# Patient Record
Sex: Male | Born: 1960 | Race: Black or African American | Hispanic: No | Smoking: Current every day smoker
Health system: Southern US, Community
[De-identification: ages and names within clinical notes are randomized; demographics above are authoritative.]

## PROBLEM LIST (undated history)

## (undated) HISTORY — PX: ROTATOR CUFF REPAIR: SHX139

---

## 1999-09-03 ENCOUNTER — Encounter: Admission: RE | Admit: 1999-09-03 | Discharge: 1999-09-13 | Payer: Self-pay | Admitting: Unknown Physician Specialty

## 2011-02-23 ENCOUNTER — Ambulatory Visit: Payer: Self-pay

## 2014-01-13 ENCOUNTER — Emergency Department (HOSPITAL_COMMUNITY): Payer: No Typology Code available for payment source

## 2014-01-13 ENCOUNTER — Emergency Department (HOSPITAL_COMMUNITY)
Admission: EM | Admit: 2014-01-13 | Discharge: 2014-01-13 | Disposition: A | Discharge status: Home / Self Care | Payer: No Typology Code available for payment source | Attending: Emergency Medicine | Admitting: Emergency Medicine

## 2014-01-13 ENCOUNTER — Encounter (HOSPITAL_COMMUNITY): Payer: Self-pay

## 2014-01-13 DIAGNOSIS — S3992XA Unspecified injury of lower back, initial encounter: Secondary | ICD-10-CM | POA: Diagnosis not present

## 2014-01-13 DIAGNOSIS — Z720 Tobacco use: Secondary | ICD-10-CM | POA: Diagnosis not present

## 2014-01-13 DIAGNOSIS — Y92410 Unspecified street and highway as the place of occurrence of the external cause: Secondary | ICD-10-CM | POA: Diagnosis not present

## 2014-01-13 DIAGNOSIS — Y9389 Activity, other specified: Secondary | ICD-10-CM | POA: Insufficient documentation

## 2014-01-13 DIAGNOSIS — M545 Low back pain, unspecified: Secondary | ICD-10-CM

## 2014-01-13 DIAGNOSIS — S199XXA Unspecified injury of neck, initial encounter: Secondary | ICD-10-CM | POA: Insufficient documentation

## 2014-01-13 DIAGNOSIS — Y998 Other external cause status: Secondary | ICD-10-CM | POA: Diagnosis not present

## 2014-01-13 DIAGNOSIS — M542 Cervicalgia: Secondary | ICD-10-CM

## 2014-01-13 MED ORDER — OXYCODONE-ACETAMINOPHEN 5-325 MG PO TABS
2.0000 | ORAL_TABLET | Freq: Once | ORAL | Status: AC
  Administered 2014-01-13: 2 via ORAL
  Filled 2014-01-13: qty 2

## 2014-01-13 MED ORDER — OXYCODONE-ACETAMINOPHEN 5-325 MG PO TABS: 1.0000 | ORAL_TABLET | Freq: Four times a day (QID) | ORAL | Status: AC | PRN

## 2014-01-13 MED ORDER — METHOCARBAMOL 500 MG PO TABS: 500.0000 mg | ORAL_TABLET | Freq: Two times a day (BID) | ORAL | Status: AC

## 2014-01-13 NOTE — ED Notes (Signed)
Patient returned from CT/XR.
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
## 2014-01-17 ENCOUNTER — Encounter (HOSPITAL_COMMUNITY): Payer: Self-pay | Admitting: Cardiology

## 2014-01-17 ENCOUNTER — Emergency Department (HOSPITAL_COMMUNITY)
Admission: EM | Admit: 2014-01-17 | Discharge: 2014-01-17 | Disposition: A | Discharge status: Home / Self Care | Payer: No Typology Code available for payment source | Attending: Emergency Medicine | Admitting: Emergency Medicine

## 2014-01-17 ENCOUNTER — Emergency Department (HOSPITAL_COMMUNITY): Payer: No Typology Code available for payment source

## 2014-01-17 DIAGNOSIS — Z79891 Long term (current) use of opiate analgesic: Secondary | ICD-10-CM | POA: Insufficient documentation

## 2014-01-17 DIAGNOSIS — Z720 Tobacco use: Secondary | ICD-10-CM | POA: Diagnosis not present

## 2014-01-17 DIAGNOSIS — M545 Low back pain: Secondary | ICD-10-CM | POA: Diagnosis not present

## 2014-01-17 DIAGNOSIS — R200 Anesthesia of skin: Secondary | ICD-10-CM | POA: Diagnosis not present

## 2014-01-17 DIAGNOSIS — M25512 Pain in left shoulder: Secondary | ICD-10-CM | POA: Diagnosis not present

## 2014-01-17 DIAGNOSIS — Z79899 Other long term (current) drug therapy: Secondary | ICD-10-CM | POA: Diagnosis not present

## 2014-01-17 DIAGNOSIS — R202 Paresthesia of skin: Secondary | ICD-10-CM | POA: Insufficient documentation

## 2014-01-17 MED ORDER — KETOROLAC TROMETHAMINE 60 MG/2ML IM SOLN
60.0000 mg | Freq: Once | INTRAMUSCULAR | Status: AC
  Administered 2014-01-17: 60 mg via INTRAMUSCULAR
  Filled 2014-01-17: qty 2

## 2014-01-17 MED ORDER — DIAZEPAM 2 MG PO TABS
2.0000 mg | ORAL_TABLET | Freq: Once | ORAL | Status: DC
  Filled 2014-01-17: qty 1

## 2014-01-17 NOTE — Discharge Instructions (Signed)
Arthralgia °Your caregiver has diagnosed you as suffering from an arthralgia. Arthralgia means there is pain in a joint. This can come from many reasons including: °· Bruising the joint which causes soreness (inflammation) in the joint. °· Wear and tear on the joints which occur as we grow older (osteoarthritis). °· Overusing the joint. °· Various forms of arthritis. °· Infections of the joint. °Regardless of the cause of pain in your joint, most of these different pains respond to anti-inflammatory drugs and rest. The exception to this is when a joint is infected, and these cases are treated with antibiotics, if it is a bacterial infection. °HOME CARE INSTRUCTIONS  °· Rest the injured area for as long as directed by your caregiver. Then slowly start using the joint as directed by your caregiver and as the pain allows. Crutches as directed may be useful if the ankles, knees or hips are involved. If the knee was splinted or casted, continue use and care as directed. If an stretchy or elastic wrapping bandage has been applied today, it should be removed and re-applied every 3 to 4 hours. It should not be applied tightly, but firmly enough to keep swelling down. Watch toes and feet for swelling, bluish discoloration, coldness, numbness or excessive pain. If any of these problems (symptoms) occur, remove the ace bandage and re-apply more loosely. If these symptoms persist, contact your caregiver or return to this location. °· For the first 24 hours, keep the injured extremity elevated on pillows while lying down. °· Apply ice for 15-20 minutes to the sore joint every couple hours while awake for the first half day. Then 03-04 times per day for the first 48 hours. Put the ice in a plastic bag and place a towel between the bag of ice and your skin. °· Wear any splinting, casting, elastic bandage applications, or slings as instructed. °· Only take over-the-counter or prescription medicines for pain, discomfort, or fever as  directed by your caregiver. Do not use aspirin immediately after the injury unless instructed by your physician. Aspirin can cause increased bleeding and bruising of the tissues. °· If you were given crutches, continue to use them as instructed and do not resume weight bearing on the sore joint until instructed. °Persistent pain and inability to use the sore joint as directed for more than 2 to 3 days are warning signs indicating that you should see a caregiver for a follow-up visit as soon as possible. Initially, a hairline fracture (break in bone) may not be evident on X-rays. Persistent pain and swelling indicate that further evaluation, non-weight bearing or use of the joint (use of crutches or slings as instructed), or further X-rays are indicated. X-rays may sometimes not show a small fracture until a week or 10 days later. Make a follow-up appointment with your own caregiver or one to whom we have referred you. A radiologist (specialist in reading X-rays) may read your X-rays. Make sure you know how you are to obtain your X-ray results. Do not assume everything is normal if you do not hear from us. °SEEK MEDICAL CARE IF: °Bruising, swelling, or pain increases. °SEEK IMMEDIATE MEDICAL CARE IF:  °· Your fingers or toes are numb or blue. °· The pain is not responding to medications and continues to stay the same or get worse. °· The pain in your joint becomes severe. °· You develop a fever over 102° F (38.9° C). °· It becomes impossible to move or use the joint. °MAKE SURE YOU:  °·   Understand these instructions.  Will watch your condition.  Will get help right away if you are not doing well or get worse. Document Released: 12/31/2004 Document Revised: 03/25/2011 Document Reviewed: 08/19/2007 [facility] Patient Information 2015 [place], [place]. This information is not intended to replace advice given to you by your health care provider. Make sure you discuss any questions you have with your health care  provider.   It is important for you to fill the medications she received earlier. Please follow-up with primary care for further evaluation and management of your symptoms. Please continue to use ice therapy to help with the swelling you are piercing. Return to ED for worsening symptoms
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
## 2018-11-30 ENCOUNTER — Other Ambulatory Visit: Payer: Self-pay

## 2018-11-30 DIAGNOSIS — Z20822 Contact with and (suspected) exposure to covid-19: Secondary | ICD-10-CM

## 2018-12-02 LAB — NOVEL CORONAVIRUS, NAA: SARS-CoV-2, NAA: NOT DETECTED
